# Patient Record
Sex: Male | Born: 1985 | Race: Black or African American | Hispanic: No | Marital: Single | Smoking: Current every day smoker
Health system: Southern US, Community
[De-identification: ages and names within clinical notes are randomized; demographics above are authoritative.]

## PROBLEM LIST (undated history)

## (undated) ENCOUNTER — Emergency Department (HOSPITAL_COMMUNITY): Admission: EM | Discharge status: Absent without leave | Payer: Self-pay

## (undated) HISTORY — PX: LEG SURGERY: SHX1003

## (undated) HISTORY — PX: OTHER SURGICAL HISTORY: SHX169

---

## 2008-07-27 ENCOUNTER — Emergency Department: Payer: Self-pay | Admitting: Internal Medicine

## 2011-11-25 ENCOUNTER — Emergency Department: Payer: Self-pay | Admitting: Emergency Medicine

## 2016-09-07 ENCOUNTER — Emergency Department
Admission: EM | Admit: 2016-09-07 | Discharge: 2016-09-07 | Disposition: A | Discharge status: Home / Self Care | Payer: Self-pay | Attending: Emergency Medicine | Admitting: Emergency Medicine

## 2016-09-07 ENCOUNTER — Encounter: Payer: Self-pay | Admitting: Emergency Medicine

## 2016-09-07 ENCOUNTER — Emergency Department: Payer: Self-pay

## 2016-09-07 DIAGNOSIS — F17200 Nicotine dependence, unspecified, uncomplicated: Secondary | ICD-10-CM | POA: Insufficient documentation

## 2016-09-07 DIAGNOSIS — M79605 Pain in left leg: Secondary | ICD-10-CM | POA: Insufficient documentation

## 2016-09-07 MED ORDER — TRAMADOL HCL 50 MG PO TABS: 50.0000 mg | ORAL_TABLET | Freq: Four times a day (QID) | ORAL | 0 refills | Status: AC | PRN

## 2016-09-07 MED ORDER — NAPROXEN 500 MG PO TABS: 500.0000 mg | ORAL_TABLET | Freq: Two times a day (BID) | ORAL | Status: DC
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
## 2016-09-07 NOTE — Discharge Instructions (Signed)
Ad[facility]  surgical department for definitive evaluation and treatment.

## 2017-05-24 ENCOUNTER — Other Ambulatory Visit: Payer: Self-pay

## 2017-05-24 ENCOUNTER — Emergency Department
Admission: EM | Admit: 2017-05-24 | Discharge: 2017-05-24 | Disposition: A | Discharge status: Home / Self Care | Payer: Self-pay | Attending: Emergency Medicine | Admitting: Emergency Medicine

## 2017-05-24 ENCOUNTER — Encounter: Payer: Self-pay | Admitting: Emergency Medicine

## 2017-05-24 DIAGNOSIS — Z711 Person with feared health complaint in whom no diagnosis is made: Secondary | ICD-10-CM | POA: Insufficient documentation

## 2017-05-24 DIAGNOSIS — Z79899 Other long term (current) drug therapy: Secondary | ICD-10-CM | POA: Insufficient documentation
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
## 2017-05-24 NOTE — ED Notes (Signed)
Patient to ED for "a leg infection." Shows this RN an area on left anterior thigh that has been present for 13 years. Area is circular with area of darker discoloration and central scabbed area. No obvious drainage noted. 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 States it is painful to bear weight. Nothing new has changed except the pain has gotten worse.

## 2017-12-29 ENCOUNTER — Emergency Department: Payer: No Typology Code available for payment source

## 2017-12-29 ENCOUNTER — Encounter: Payer: Self-pay | Admitting: *Deleted

## 2017-12-29 ENCOUNTER — Emergency Department
Admission: EM | Admit: 2017-12-29 | Discharge: 2017-12-30 | Disposition: A | Discharge status: Home / Self Care | Payer: No Typology Code available for payment source | Attending: Emergency Medicine | Admitting: Emergency Medicine

## 2017-12-29 ENCOUNTER — Other Ambulatory Visit: Payer: Self-pay

## 2017-12-29 DIAGNOSIS — Y9355 Activity, bike riding: Secondary | ICD-10-CM | POA: Insufficient documentation

## 2017-12-29 DIAGNOSIS — Z23 Encounter for immunization: Secondary | ICD-10-CM | POA: Diagnosis not present

## 2017-12-29 DIAGNOSIS — Y929 Unspecified place or not applicable: Secondary | ICD-10-CM | POA: Diagnosis not present

## 2017-12-29 DIAGNOSIS — F17210 Nicotine dependence, cigarettes, uncomplicated: Secondary | ICD-10-CM | POA: Diagnosis not present

## 2017-12-29 DIAGNOSIS — Y999 Unspecified external cause status: Secondary | ICD-10-CM | POA: Diagnosis not present

## 2017-12-29 DIAGNOSIS — S82045A Nondisplaced comminuted fracture of left patella, initial encounter for closed fracture: Secondary | ICD-10-CM | POA: Diagnosis not present

## 2017-12-29 DIAGNOSIS — S8992XA Unspecified injury of left lower leg, initial encounter: Secondary | ICD-10-CM | POA: Diagnosis present

## 2017-12-29 MED ORDER — TETANUS-DIPHTH-ACELL PERTUSSIS 5-2.5-18.5 LF-MCG/0.5 IM SUSP
0.5000 mL | Freq: Once | INTRAMUSCULAR | Status: AC
Start: 2017-12-29 — End: 2017-12-29
  Administered 2017-12-29: 0.5 mL via INTRAMUSCULAR
  Filled 2017-12-29: qty 0.5

## 2017-12-29 MED ORDER — LIDOCAINE HCL (PF) 1 % IJ SOLN
5.0000 mL | Freq: Once | INTRAMUSCULAR | Status: AC
  Administered 2017-12-29: 5 mL via INTRADERMAL
  Filled 2017-12-29: qty 5

## 2017-12-29 MED ORDER — FENTANYL CITRATE (PF) 100 MCG/2ML IJ SOLN
100.0000 ug | Freq: Once | INTRAMUSCULAR | Status: AC
  Administered 2017-12-29: 100 ug via INTRAVENOUS
  Filled 2017-12-29: qty 2
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
## 2017-12-30 MED ORDER — OXYCODONE-ACETAMINOPHEN 5-325 MG PO TABS
ORAL_TABLET | ORAL | Status: AC
  Filled 2017-12-30: qty 1

## 2017-12-30 MED ORDER — HYDROCODONE-ACETAMINOPHEN 5-325 MG PO TABS
ORAL_TABLET | ORAL | Status: AC
  Filled 2017-12-30: qty 1

## 2017-12-30 MED ORDER — HYDROCODONE-ACETAMINOPHEN 5-325 MG PO TABS: 1.0000 | ORAL_TABLET | Freq: Four times a day (QID) | ORAL | 0 refills | Status: DC | PRN

## 2017-12-30 MED ORDER — KETOROLAC TROMETHAMINE 30 MG/ML IJ SOLN
15.0000 mg | Freq: Once | INTRAMUSCULAR | Status: AC
  Administered 2017-12-30: 15 mg via INTRAVENOUS

## 2017-12-30 MED ORDER — HYDROCODONE-ACETAMINOPHEN 5-325 MG PO TABS
1.0000 | ORAL_TABLET | Freq: Once | ORAL | Status: AC
  Administered 2017-12-30: 1 via ORAL

## 2017-12-30 MED ORDER — KETOROLAC TROMETHAMINE 30 MG/ML IJ SOLN
INTRAMUSCULAR | Status: AC
  Filled 2017-12-30: qty 1
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
## 2019-03-04 ENCOUNTER — Other Ambulatory Visit: Payer: Self-pay

## 2019-03-04 ENCOUNTER — Emergency Department (HOSPITAL_COMMUNITY): Payer: No Typology Code available for payment source

## 2019-03-04 ENCOUNTER — Encounter (HOSPITAL_COMMUNITY): Payer: Self-pay

## 2019-03-04 ENCOUNTER — Emergency Department (HOSPITAL_COMMUNITY)
Admission: EM | Admit: 2019-03-04 | Discharge: 2019-03-05 | Disposition: A | Discharge status: Home / Self Care | Payer: No Typology Code available for payment source | Attending: Emergency Medicine | Admitting: Emergency Medicine

## 2019-03-04 DIAGNOSIS — M545 Low back pain: Secondary | ICD-10-CM | POA: Insufficient documentation

## 2019-03-04 DIAGNOSIS — Y929 Unspecified place or not applicable: Secondary | ICD-10-CM | POA: Insufficient documentation

## 2019-03-04 DIAGNOSIS — Y999 Unspecified external cause status: Secondary | ICD-10-CM | POA: Insufficient documentation

## 2019-03-04 DIAGNOSIS — M546 Pain in thoracic spine: Secondary | ICD-10-CM | POA: Insufficient documentation

## 2019-03-04 DIAGNOSIS — R519 Headache, unspecified: Secondary | ICD-10-CM | POA: Diagnosis not present

## 2019-03-04 DIAGNOSIS — S0101XA Laceration without foreign body of scalp, initial encounter: Secondary | ICD-10-CM | POA: Insufficient documentation

## 2019-03-04 DIAGNOSIS — Y939 Activity, unspecified: Secondary | ICD-10-CM | POA: Diagnosis not present

## 2019-03-04 DIAGNOSIS — F17210 Nicotine dependence, cigarettes, uncomplicated: Secondary | ICD-10-CM | POA: Insufficient documentation

## 2019-03-04 DIAGNOSIS — M542 Cervicalgia: Secondary | ICD-10-CM | POA: Diagnosis not present

## 2019-03-04 LAB — URINALYSIS, ROUTINE W REFLEX MICROSCOPIC
Bacteria, UA: NONE SEEN
Bilirubin Urine: NEGATIVE
Glucose, UA: NEGATIVE mg/dL
Ketones, ur: NEGATIVE mg/dL
Nitrite: NEGATIVE
Protein, ur: NEGATIVE mg/dL
Specific Gravity, Urine: 1.011 (ref 1.005–1.030)
pH: 5 (ref 5.0–8.0)

## 2019-03-04 LAB — COMPREHENSIVE METABOLIC PANEL
ALT: 27 U/L (ref 0–44)
AST: 27 U/L (ref 15–41)
Albumin: 4 g/dL (ref 3.5–5.0)
Alkaline Phosphatase: 57 U/L (ref 38–126)
Anion gap: 10 (ref 5–15)
BUN: 9 mg/dL (ref 6–20)
CO2: 26 mmol/L (ref 22–32)
Calcium: 9.6 mg/dL (ref 8.9–10.3)
Chloride: 104 mmol/L (ref 98–111)
Creatinine, Ser: 1.34 mg/dL — ABNORMAL HIGH (ref 0.61–1.24)
GFR calc Af Amer: >60 mL/min (ref >60)
GFR calc non Af Amer: >60 mL/min (ref >60)
Glucose, Bld: 98 mg/dL (ref 70–99)
Potassium: 3.8 mmol/L (ref 3.5–5.1)
Sodium: 140 mmol/L (ref 135–145)
Total Bilirubin: 0.5 mg/dL (ref 0.3–1.2)
Total Protein: 7 g/dL (ref 6.5–8.1)

## 2019-03-04 LAB — I-STAT CHEM 8, ED
BUN: 12 mg/dL (ref 6–20)
Calcium, Ion: 1.15 mmol/L (ref 1.15–1.40)
Chloride: 103 mmol/L (ref 98–111)
Creatinine, Ser: 1.3 mg/dL — ABNORMAL HIGH (ref 0.61–1.24)
Glucose, Bld: 81 mg/dL (ref 70–99)
HCT: 46 % (ref 39.0–52.0)
Hemoglobin: 15.6 g/dL (ref 13.0–17.0)
Potassium: 4 mmol/L (ref 3.5–5.1)
Sodium: 139 mmol/L (ref 135–145)
TCO2: 26 mmol/L (ref 22–32)

## 2019-03-04 LAB — CBC
HCT: 45.5 % (ref 39.0–52.0)
Hemoglobin: 15 g/dL (ref 13.0–17.0)
MCH: 32.3 pg (ref 26.0–34.0)
MCHC: 33 g/dL (ref 30.0–36.0)
MCV: 97.8 fL (ref 80.0–100.0)
Platelets: 190 10*3/uL (ref 150–400)
RBC: 4.65 MIL/uL (ref 4.22–5.81)
RDW: 12.4 % (ref 11.5–15.5)
WBC: 6.6 10*3/uL (ref 4.0–10.5)
nRBC: 0 % (ref 0.0–0.2)

## 2019-03-04 LAB — LACTIC ACID, PLASMA: Lactic Acid, Venous: 2 mmol/L (ref 0.5–1.9)

## 2019-03-04 LAB — SAMPLE TO BLOOD BANK

## 2019-03-04 LAB — CDS SEROLOGY

## 2019-03-04 MED ORDER — IOHEXOL 300 MG/ML  SOLN
100.0000 mL | Freq: Once | INTRAMUSCULAR | Status: AC | PRN
  Administered 2019-03-04: 100 mL via INTRAVENOUS

## 2019-03-04 MED ORDER — SODIUM CHLORIDE 0.9 % IV BOLUS
10.0000 mL/kg | Freq: Once | INTRAVENOUS | Status: AC
  Administered 2019-03-04: 21:00:00 776 mL via INTRAVENOUS

## 2019-03-04 MED ORDER — TETANUS-DIPHTH-ACELL PERTUSSIS 5-2.5-18.5 LF-MCG/0.5 IM SUSP
0.5000 mL | Freq: Once | INTRAMUSCULAR | Status: AC
  Administered 2019-03-05: 0.5 mL via INTRAMUSCULAR
  Filled 2019-03-04: qty 0.5

## 2019-03-04 MED ORDER — LIDOCAINE-EPINEPHRINE 1 %-1:100000 IJ SOLN
10.0000 mL | Freq: Once | INTRAMUSCULAR | Status: AC
  Administered 2019-03-05: 10 mL via INTRADERMAL
  Filled 2019-03-04: qty 10

## 2019-03-04 NOTE — ED Notes (Signed)
Patient transported to CT 
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
## 2019-03-04 NOTE — ED Notes (Signed)
Pt vomits 300cc emesis on arrival to room. Pt denies LOC but c/o abdomin al tenderness.

## 2019-03-04 NOTE — ED Notes (Signed)
Report to [place], [place]

## 2019-03-05 MED ORDER — CYCLOBENZAPRINE HCL 10 MG PO TABS: 10.0000 mg | ORAL_TABLET | Freq: Two times a day (BID) | ORAL | 0 refills | Status: AC | PRN
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
## 2019-12-03 ENCOUNTER — Ambulatory Visit: Payer: No Typology Code available for payment source | Attending: Critical Care Medicine

## 2019-12-03 DIAGNOSIS — Z23 Encounter for immunization: Secondary | ICD-10-CM

## 2019-12-03 NOTE — Progress Notes (Signed)
   Covid-19 Vaccination Clinic  Name:  Alan Houston    MRN: [id number] DOB: [birth year]  12/03/2019  Alan Houston was observed post Covid-19 immunization for 15 minutes without incident. He was provided with Vaccine Information Sheet and instruction to access the V-Safe system.   Alan Houston was instructed to call 911 with any severe reactions post vaccine: Marland Kitchen Difficulty breathing  . Swelling of face and throat  . A fast heartbeat  . A bad rash all over body  . Dizziness and weakness   Immunizations Administered    Name Date Dose VIS Date Route   Pfizer COVID-19 Vaccine 12/03/2019  2:37 PM 0.3 mL 06/20/2018 Intramuscular   Manufacturer: [employer], [employer]   Lot: [id number]   NDC: [address]-6

## 2019-12-24 ENCOUNTER — Ambulatory Visit: Payer: Self-pay

## 2020-11-14 ENCOUNTER — Ambulatory Visit: Payer: Self-pay
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
## 2021-09-11 ENCOUNTER — Emergency Department
Admission: EM | Admit: 2021-09-11 | Discharge: 2021-09-11 | Disposition: A | Discharge status: Home / Self Care | Payer: Self-pay | Attending: Emergency Medicine | Admitting: Emergency Medicine

## 2021-09-11 ENCOUNTER — Other Ambulatory Visit: Payer: Self-pay

## 2021-09-11 ENCOUNTER — Encounter: Payer: Self-pay | Admitting: Emergency Medicine

## 2021-09-11 DIAGNOSIS — H1032 Unspecified acute conjunctivitis, left eye: Secondary | ICD-10-CM | POA: Insufficient documentation

## 2021-09-11 DIAGNOSIS — F17210 Nicotine dependence, cigarettes, uncomplicated: Secondary | ICD-10-CM | POA: Insufficient documentation

## 2021-09-11 DIAGNOSIS — B9689 Other specified bacterial agents as the cause of diseases classified elsewhere: Secondary | ICD-10-CM | POA: Insufficient documentation

## 2021-09-11 MED ORDER — TOBRAMYCIN 0.3 % OP SOLN: 2.0000 [drp] | OPHTHALMIC | 0 refills | Status: AC

## 2021-09-11 NOTE — ED Triage Notes (Signed)
Pt to ED from home c/o left eye irritation for a couple days.  Sclera red, watery, drainage, and painful.

## 2021-09-11 NOTE — Discharge Instructions (Addendum)
Call make an appointment with [facility] if any continued problems or not improving.  Begin using the eyedrops that were sent to your pharmacy every 4 hours while you are awake.  Should your right eye began getting red begin using the eyedrops there also.  This is very contagious and can spread to other people.

## 2021-09-11 NOTE — ED Provider Notes (Signed)
[facility] Provider Note    Event Date/Time   First MD Initiated Contact with Patient 09/11/21 [phone]     (approximate)   History   Conjunctivitis   HPI  Alan Houston is a 36 y.o. male presents to the ED with complaint of drainage from his left eye that began 4 days ago and has gradually gotten worse.  Patient is unaware of any known contact with conjunctivitis.  He states he has been getting his mother to clean his eye.  He denies any foreign body sensation or photophobia.  Patient has no medical history and smokes cigarettes daily.     Physical Exam   Triage Vital Signs: ED Triage Vitals  Enc Vitals Group     BP 09/11/21 0644 138/77     Pulse Rate 09/11/21 0644 83     Resp 09/11/21 0644 16     Temp 09/11/21 0644 98.4 F (36.9 C)     Temp src --      SpO2 09/11/21 0644 97 %     Weight 09/11/21 0645 170 lb (77.1 kg)     Height 09/11/21 0645 5\' 6"  (1.676 m)     Head Circumference --      Peak Flow --      Pain Score 09/11/21 0645 10     Pain Loc --      Pain Edu? --      Excl. in GC? --     Most recent vital signs: Vitals:   09/11/21 0644  BP: 138/77  Pulse: 83  Resp: 16  Temp: 98.4 F (36.9 C)  SpO2: 97%     General: Awake, no distress.  Patient has copious amounts of left eye drainage which is thick and yellow in color.  Left conjunctive a is erythematous and injected.  Eyelashes are matted. CV:  Good peripheral perfusion.  Resp:  Normal effort.  Abd:  No distention.  Other:     ED Results / Procedures / Treatments   Labs (all labs ordered are listed, but only abnormal results are displayed) Labs Reviewed - No data to display     PROCEDURES:  Critical Care performed:   Procedures   MEDICATIONS ORDERED IN ED: Medications - No data to display   IMPRESSION / MDM / ASSESSMENT AND PLAN / ED COURSE  I reviewed the triage vital signs and the nursing notes.   Differential diagnosis includes, but is not limited to,  bacterial conjunctivitis, periorbital cellulitis, viral conjunctivitis.  36 year old male presents to the ED with complaint of drainage from his left eye that has been going on for approximately 4 days.  Patient states the drainage has gotten worse.  There is findings consistent with a bacterial conjunctivitis.  Patient was made aware that this is contagious and also it can spread from his left eye to his right should he rub his eye after touching the affected eye.  Patient is aware that a prescription for antibiotic eyedrops are being sent to the pharmacy for him to use.  If any continued problems or not improving he is to follow-up with [facility].  Over the weekend return to the emergency department if any severe worsening or changes with his vision.   FINAL CLINICAL IMPRESSION(S) / ED DIAGNOSES   Final diagnoses:  Acute bacterial conjunctivitis of left eye     Rx / DC Orders   ED Discharge Orders          Ordered  tobramycin (TOBREX) 0.3 % ophthalmic solution  Every 4 hours        09/11/21 0744             Note:  This document was prepared using Dragon voice recognition software and may include unintentional dictation errors.   Johnn Hai, PA-C 09/11/21 1211    Carrie Mew, MD 09/12/21 [phone]

## 2021-09-11 NOTE — ED Notes (Signed)
See triage note. Pt presents to ED for L eye swelling, pain, and redness since Monday. Pt states eye is sensitivity to light. Pt is A&Ox4 and NAD.

## 2021-09-14 ENCOUNTER — Encounter (HOSPITAL_COMMUNITY): Payer: Self-pay
